# Patient Record
Sex: Female | Born: 1990 | Race: Black or African American | Hispanic: No | Marital: Single
Health system: Southern US, Community
[De-identification: ages and names within clinical notes are randomized; demographics above are authoritative.]

---

## 2020-04-01 ENCOUNTER — Emergency Department: Payer: BC Managed Care – PPO

## 2020-04-01 ENCOUNTER — Emergency Department
Admission: EM | Admit: 2020-04-01 | Discharge: 2020-04-01 | Disposition: A | Discharge status: Home / Self Care | Payer: BC Managed Care – PPO | Attending: Emergency Medicine | Admitting: Emergency Medicine

## 2020-04-01 ENCOUNTER — Other Ambulatory Visit: Payer: Self-pay

## 2020-04-01 DIAGNOSIS — R0602 Shortness of breath: Secondary | ICD-10-CM | POA: Insufficient documentation

## 2020-04-01 DIAGNOSIS — J45909 Unspecified asthma, uncomplicated: Secondary | ICD-10-CM | POA: Diagnosis not present

## 2020-04-01 DIAGNOSIS — R0789 Other chest pain: Secondary | ICD-10-CM | POA: Diagnosis not present

## 2020-04-01 DIAGNOSIS — R519 Headache, unspecified: Secondary | ICD-10-CM | POA: Insufficient documentation

## 2020-04-01 DIAGNOSIS — R5383 Other fatigue: Secondary | ICD-10-CM | POA: Insufficient documentation

## 2020-04-01 LAB — BASIC METABOLIC PANEL
Anion gap: 6 (ref 5–15)
BUN: 7 mg/dL (ref 6–20)
CO2: 27 mmol/L (ref 22–32)
Calcium: 8.9 mg/dL (ref 8.9–10.3)
Chloride: 106 mmol/L (ref 98–111)
Creatinine, Ser: 0.77 mg/dL (ref 0.44–1.00)
GFR calc Af Amer: >60 mL/min (ref >60)
GFR calc non Af Amer: >60 mL/min (ref >60)
Glucose, Bld: 106 mg/dL — ABNORMAL HIGH (ref 70–99)
Potassium: 3.5 mmol/L (ref 3.5–5.1)
Sodium: 139 mmol/L (ref 135–145)

## 2020-04-01 LAB — CBC
HCT: 33.4 % — ABNORMAL LOW (ref 36.0–46.0)
Hemoglobin: 11.1 g/dL — ABNORMAL LOW (ref 12.0–15.0)
MCH: 29.3 pg (ref 26.0–34.0)
MCHC: 33.2 g/dL (ref 30.0–36.0)
MCV: 88.1 fL (ref 80.0–100.0)
Platelets: 256 10*3/uL (ref 150–400)
RBC: 3.79 MIL/uL — ABNORMAL LOW (ref 3.87–5.11)
RDW: 12.7 % (ref 11.5–15.5)
WBC: 10.7 10*3/uL — ABNORMAL HIGH (ref 4.0–10.5)
nRBC: 0 % (ref 0.0–0.2)

## 2020-04-01 LAB — HEPATIC FUNCTION PANEL
ALT: 17 U/L (ref 0–44)
AST: 24 U/L (ref 15–41)
Albumin: 3.9 g/dL (ref 3.5–5.0)
Alkaline Phosphatase: 43 U/L (ref 38–126)
Bilirubin, Direct: 0.1 mg/dL (ref 0.0–0.2)
Indirect Bilirubin: 0.8 mg/dL (ref 0.3–0.9)
Total Bilirubin: 0.9 mg/dL (ref 0.3–1.2)
Total Protein: 6.9 g/dL (ref 6.5–8.1)

## 2020-04-01 LAB — LIPASE, BLOOD: Lipase: 24 U/L (ref 11–51)

## 2020-04-01 LAB — TROPONIN I (HIGH SENSITIVITY): Troponin I (High Sensitivity): <2 ng/L (ref <18)

## 2020-04-01 MED ORDER — IBUPROFEN 600 MG PO TABS
600.0000 mg | ORAL_TABLET | Freq: Once | ORAL | Status: AC
  Administered 2020-04-01: 600 mg via ORAL
  Filled 2020-04-01: qty 1

## 2020-04-01 NOTE — ED Notes (Signed)
AAOx3.  Skin warm and dry.  NAD 

## 2020-04-01 NOTE — ED Provider Notes (Signed)
[facility] Emergency Department Provider Note  ____________________________________________   First MD Initiated Contact with Patient 04/01/20 [phone]     (approximate)  I have reviewed the triage vital signs and the nursing notes.   HISTORY  Chief Complaint Chest Pain    HPI Jennifer Kent is a 29 y.o. female  Here with chest pain. Pt reports that she awoke this morning with a pressure-like, pulling sensation in her upper chest, particularly the left upper area. She felt fine going to bed, ate a normal meal and a snack at 8 PM. She awoke around 3:30 AM from what she feels is a dull, aching, substernal chest pain. The pain has been constant, unchanging until she checked in, and is slightly improving. It improved with movement. She felt like she had a hard time catching her breath at the time of onset. No cough, hemoptysis, leg swelling. She is not on blood thinners, does not smoke. Denies h/o DVT/PE. No other complaints. No recent med changes, but did receive second dose of COVID-19 vaccination on Saturday. She has had a mild headache since then as well. No family h/o early CAD that she is aware of.        No past medical history on file.   PMHx: mild asthma, well controlled  PSHx: Non-contributory  FHx: Father with DVT/PE (likely provoked), no early heart disease  SHx: non-smoker, no drug use  There are no problems to display for this patient.    Prior to Admission medications   Not on File    Allergies Patient has no known allergies.  No family history on file.  Social History Social History   Tobacco Use  . Smoking status: Not on file  Substance Use Topics  . Alcohol use: Not on file  . Drug use: Not on file    Review of Systems  Review of Systems  Constitutional: Positive for fatigue. Negative for fever.  HENT: Negative for congestion and sore throat.   Eyes: Negative for visual disturbance.  Respiratory: Positive for chest  tightness and shortness of breath. Negative for cough.   Cardiovascular: Positive for chest pain.  Gastrointestinal: Negative for abdominal pain, diarrhea, nausea and vomiting.  Genitourinary: Negative for flank pain.  Musculoskeletal: Negative for back pain and neck pain.  Skin: Negative for rash and wound.  Neurological: Negative for weakness.  All other systems reviewed and are negative.    ____________________________________________  PHYSICAL EXAM:      VITAL SIGNS: ED Triage Vitals  Enc Vitals Group     BP 04/01/20 0608 114/78     Pulse Rate 04/01/20 0608 86     Resp 04/01/20 0608 17     Temp 04/01/20 0608 98.8 F (37.1 C)     Temp Source 04/01/20 0608 Oral     SpO2 04/01/20 0608 98 %     Weight 04/01/20 0607 171 lb (77.6 kg)     Height 04/01/20 0607 5\' 4"  (1.626 m)     Head Circumference --      Peak Flow --      Pain Score 04/01/20 0606 3     Pain Loc --      Pain Edu? --      Excl. in GC? --      Physical Exam Vitals and nursing note reviewed.  Constitutional:      General: She is not in acute distress.    Appearance: She is well-developed.  HENT:     Head: Normocephalic  and atraumatic.  Eyes:     Conjunctiva/sclera: Conjunctivae normal.  Cardiovascular:     Rate and Rhythm: Normal rate and regular rhythm.     Heart sounds: Normal heart sounds.  Pulmonary:     Effort: Pulmonary effort is normal. No respiratory distress.     Breath sounds: No wheezing.     Comments: No significant chest wall TTP Abdominal:     General: There is no distension.  Musculoskeletal:     Cervical back: Neck supple.  Skin:    General: Skin is warm.     Capillary Refill: Capillary refill takes less than 2 seconds.     Findings: No rash.  Neurological:     Mental Status: She is alert and oriented to person, place, and time.     Motor: No abnormal muscle tone.       ____________________________________________   LABS (all labs ordered are listed, but only abnormal  results are displayed)  Labs Reviewed  BASIC METABOLIC PANEL - Abnormal; Notable for the following components:      Result Value   Glucose, Bld 106 (*)    All other components within normal limits  CBC - Abnormal; Notable for the following components:   WBC 10.7 (*)    RBC 3.79 (*)    Hemoglobin 11.1 (*)    HCT 33.4 (*)    All other components within normal limits  LIPASE, BLOOD  HEPATIC FUNCTION PANEL  POC URINE PREG, ED  TROPONIN I (HIGH SENSITIVITY)    ____________________________________________  EKG: Normal sinus rhythm, VR 76. QRS 80, QTC 387. No acute ST elevations or depressions. No ischemia or infarct. ________________________________________  RADIOLOGY All imaging, including plain films, CT scans, and ultrasounds, independently reviewed by me, and interpretations confirmed via formal radiology reads.  ED MD interpretation:   CXR: Clear  Official radiology report(s): DG Chest 2 View  Result Date: 04/01/2020 CLINICAL DATA:  29 year old female with history of chest pain. EXAM: CHEST - 2 VIEW COMPARISON:  No priors. FINDINGS: Lung volumes are normal. No consolidative airspace disease. No pleural effusions. No pneumothorax. No pulmonary nodule or mass noted. Pulmonary vasculature and the cardiomediastinal silhouette are within normal limits. IMPRESSION: No radiographic evidence of acute cardiopulmonary disease. Electronically Signed   By: Trudie Reed M.D.   On: 04/01/2020 06:27    ____________________________________________  PROCEDURES   Procedure(s) performed (including Critical Care):  Procedures  ____________________________________________  INITIAL IMPRESSION / MDM / ASSESSMENT AND PLAN / ED COURSE  As part of my medical decision making, I reviewed the following data within the electronic MEDICAL RECORD NUMBER Nursing notes reviewed and incorporated, Old chart reviewed, Notes from prior ED visits, and [place] Controlled Substance Database       *Jennifer Kent was evaluated in Emergency Department on 04/01/2020 for the symptoms described in the history of present illness. She was evaluated in the context of the global COVID-19 pandemic, which necessitated consideration that the patient might be at risk for infection with the SARS-CoV-2 virus that causes COVID-19. Institutional protocols and algorithms that pertain to the evaluation of patients at risk for COVID-19 are in a state of rapid change based on information released by regulatory bodies including the CDC and federal and state organizations. These policies and algorithms were followed during the patient's care in the ED.  Some ED evaluations and interventions may be delayed as a result of limited staffing during the pandemic.*     Medical Decision Making:  Very pleasant 29 yo F  here with atypical upper chest pain s/p COVID vaccination. On arrival, EKG is nonischemic and pt is well appearing with normal work of breathing. Troponin negative with reassuring, normal EKG despite sx >3 hours - low suspicion for ACS or ischemic etiology. She is not on OCPs and is PERC negative, with no signs of DVT/PE - doubt PE. Pain is not c/w dissection. Her abdomen is soft, NT, ND, and lipase, LFTs are normal - doubt significant PUD, pancreatitis, cholecystitis. Her lungs are CTAB without signs of asthma exacerbation.  Given correlation with recent COVID vaccination and reproducible nature w/ positionality, suspect chest wall pain. Will treat with scheduled NSAIDs, outpatient follow-up, and good return precautions.  ____________________________________________  FINAL CLINICAL IMPRESSION(S) / ED DIAGNOSES  Final diagnoses:  Atypical chest pain     MEDICATIONS GIVEN DURING THIS VISIT:  Medications  ibuprofen (ADVIL) tablet 600 mg (600 mg Oral Given 04/01/20 0730)     ED Discharge Orders    None       Note:  This document was prepared using Dragon voice recognition software and may include unintentional  dictation errors.   Duffy Bruce, MD 04/01/20 [phone]

## 2020-04-01 NOTE — Discharge Instructions (Signed)
As we discussed, your XRay, lab work and EKG all looked very reassuring.  My suspicion is that your chest pain is due to muscle inflammation and spasm related to your recent COVID vaccination.   For this, I'd recommend the following over-the-counter regimen: - Ibuprofen 600 mg every 8 hours for 3 days, then as needed - Tylenol 1000 mg every 8 hours as needed for additional pain  Make sure you take the Ibuprofen with food, to help prevent reflux or heartburn

## 2020-04-01 NOTE — ED Triage Notes (Signed)
Reports chest pain since 3:30 am.

## 2021-03-26 IMAGING — CR DG CHEST 2V
2 series · 2 of 2 positions shown · non-contrast
Comparison: No priors.

CLINICAL DATA: 29-year-old female with history of chest pain.

EXAM:
CHEST - 2 VIEW

[chest pa]
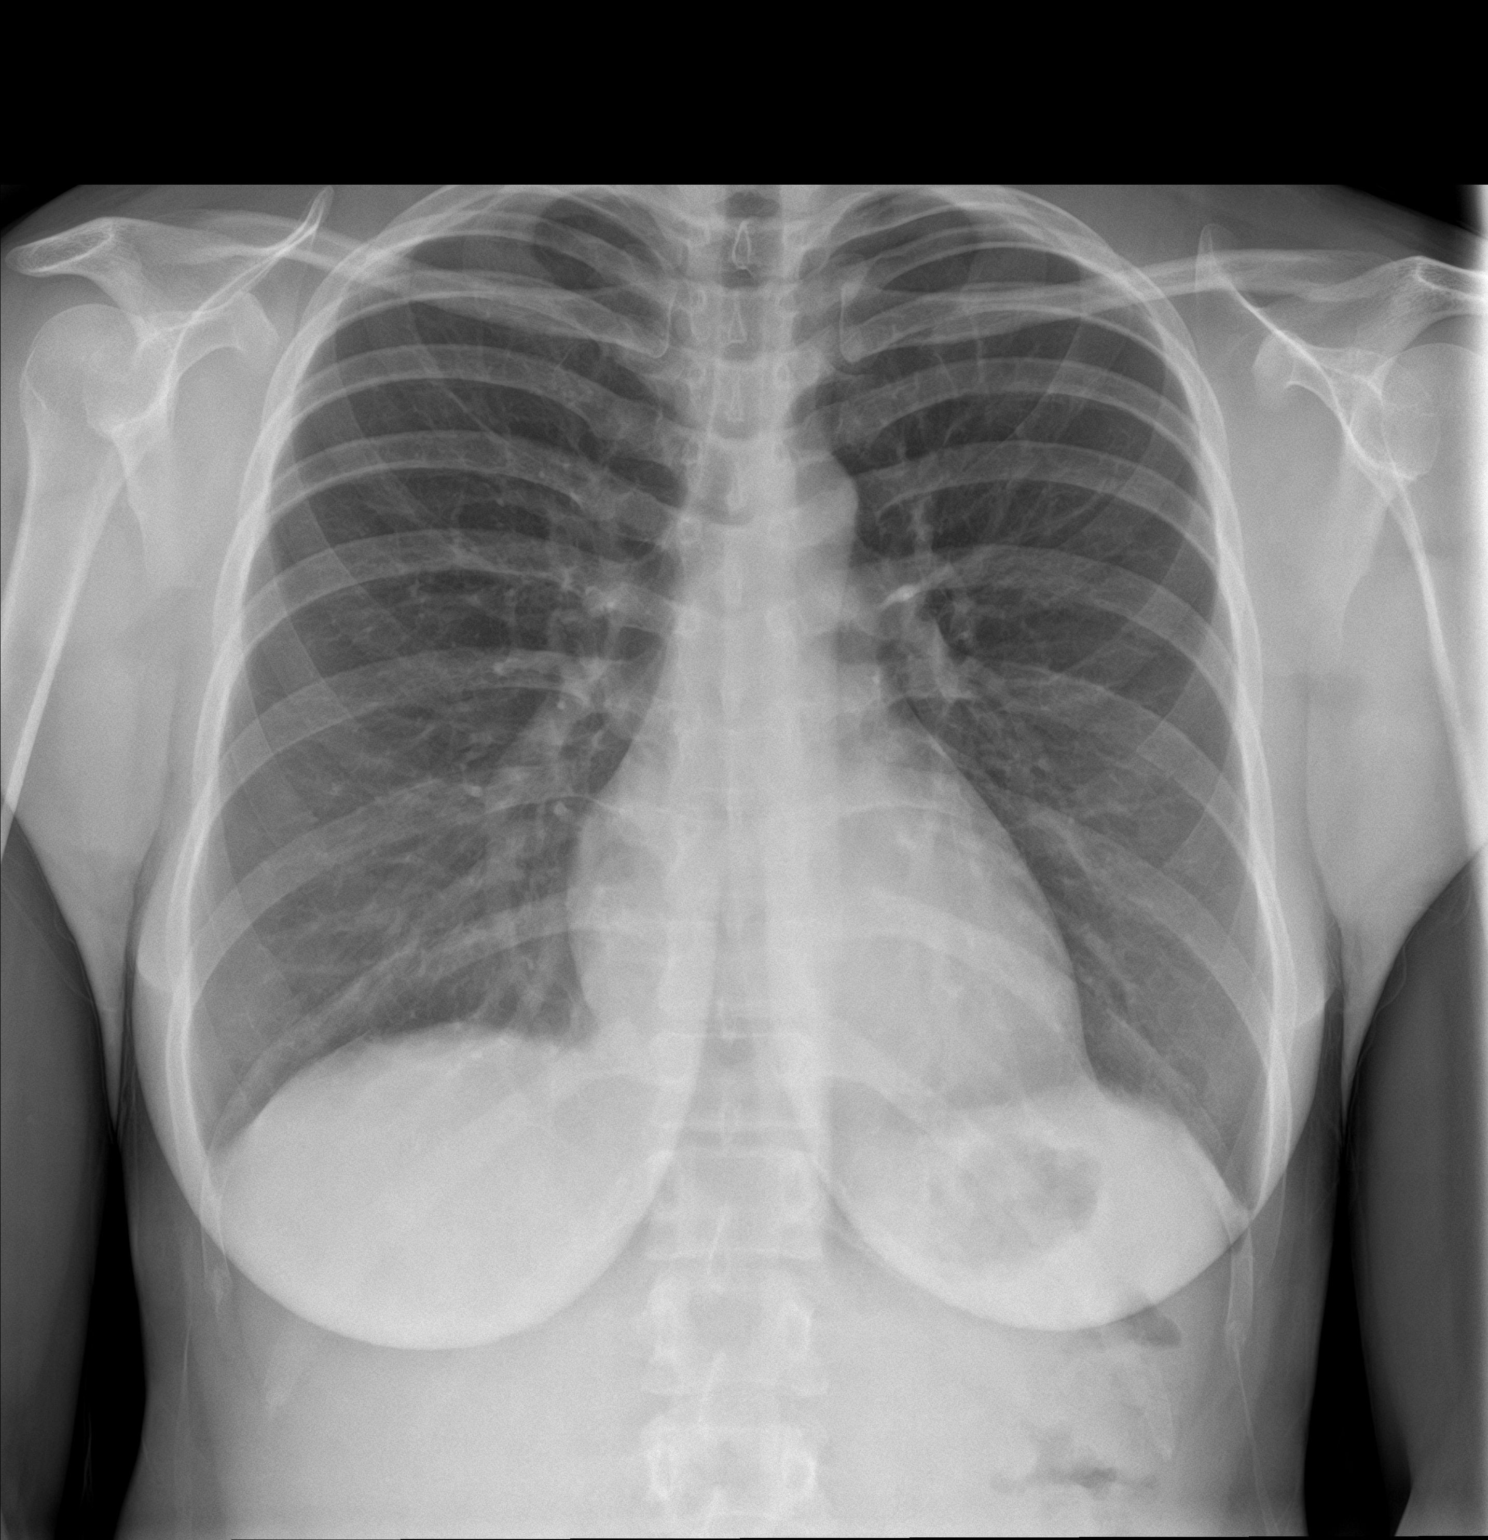

[chest lat]
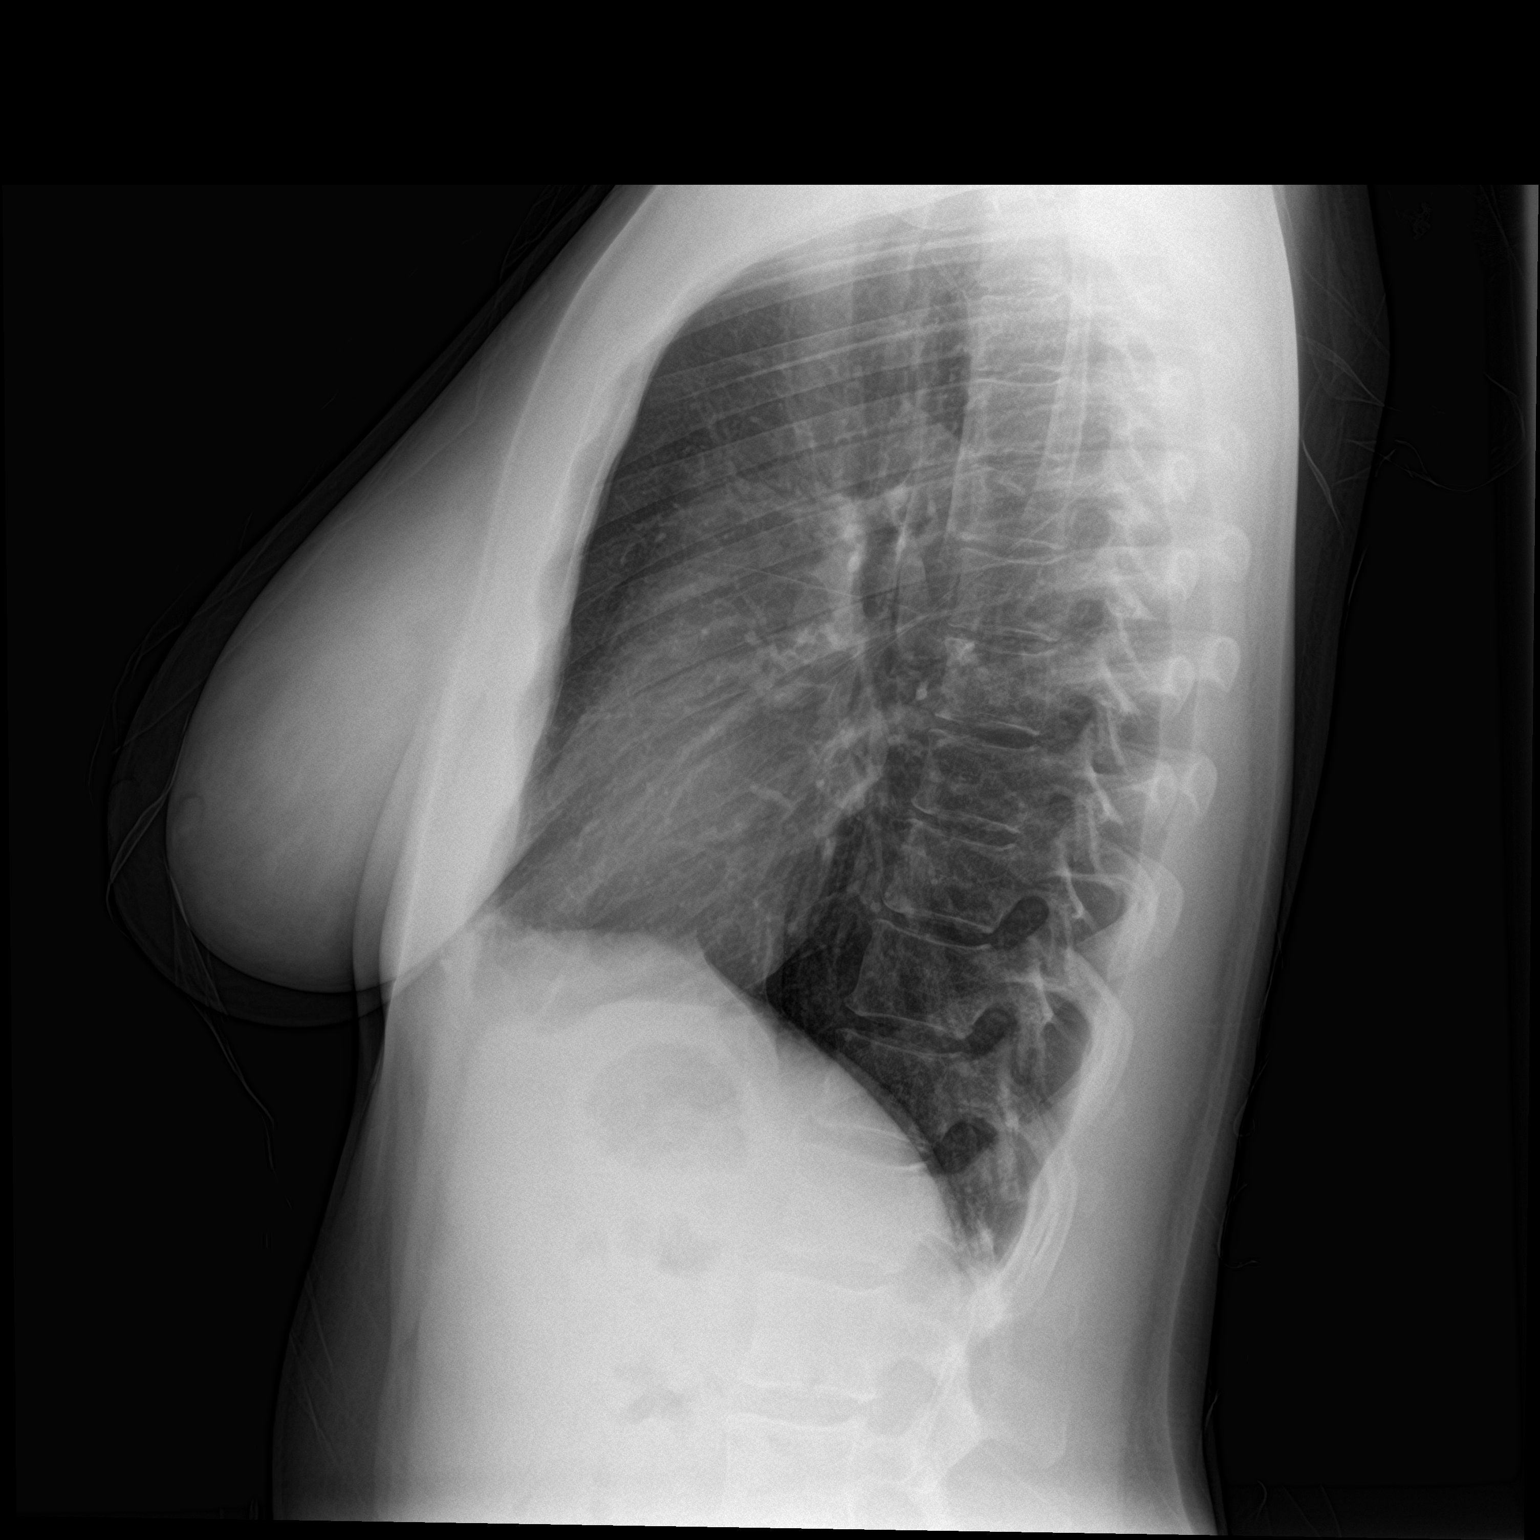

[2 of 2 positions shown; findings below may reference images not displayed]

FINDINGS: Lung volumes are normal. No consolidative airspace disease. No
pleural effusions. No pneumothorax. No pulmonary nodule or mass
noted. Pulmonary vasculature and the cardiomediastinal silhouette
are within normal limits.
IMPRESSION: No radiographic evidence of acute cardiopulmonary disease.
